# Patient Record
Sex: Male | Born: 2000 | Race: Black or African American | Hispanic: No | Marital: Single | Smoking: Never smoker
Health system: Southern US, Community
[De-identification: ages and names within clinical notes are randomized; demographics above are authoritative.]

## PROBLEM LIST (undated history)

## (undated) ENCOUNTER — Emergency Department (HOSPITAL_COMMUNITY): Admission: EM | Payer: No Typology Code available for payment source

---
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
## 2022-01-02 ENCOUNTER — Encounter (HOSPITAL_COMMUNITY): Payer: Self-pay

## 2022-01-02 ENCOUNTER — Emergency Department (HOSPITAL_COMMUNITY): Payer: Medicaid Other

## 2022-01-02 ENCOUNTER — Inpatient Hospital Stay (HOSPITAL_COMMUNITY)
Admission: EM | Admit: 2022-01-02 | Discharge: 2022-01-06 | DRG: 907 | Disposition: A | Discharge status: Home / Self Care | Payer: Medicaid Other | Attending: Surgery | Admitting: Surgery

## 2022-01-02 ENCOUNTER — Encounter (HOSPITAL_COMMUNITY): Admission: EM | Disposition: A | Discharge status: Home / Self Care | Payer: Self-pay | Source: Home / Self Care

## 2022-01-02 DIAGNOSIS — S82202B Unspecified fracture of shaft of left tibia, initial encounter for open fracture type I or II: Principal | ICD-10-CM

## 2022-01-02 DIAGNOSIS — S81832A Puncture wound without foreign body, left lower leg, initial encounter: Principal | ICD-10-CM

## 2022-01-02 DIAGNOSIS — S8412XA Injury of peroneal nerve at lower leg level, left leg, initial encounter: Secondary | ICD-10-CM | POA: Diagnosis present

## 2022-01-02 DIAGNOSIS — S85152A Other specified injury of anterior tibial artery, left leg, initial encounter: Principal | ICD-10-CM | POA: Diagnosis present

## 2022-01-02 DIAGNOSIS — D62 Acute posthemorrhagic anemia: Secondary | ICD-10-CM | POA: Diagnosis present

## 2022-01-02 DIAGNOSIS — E872 Acidosis, unspecified: Secondary | ICD-10-CM | POA: Diagnosis present

## 2022-01-02 DIAGNOSIS — S82252B Displaced comminuted fracture of shaft of left tibia, initial encounter for open fracture type I or II: Secondary | ICD-10-CM | POA: Diagnosis present

## 2022-01-02 DIAGNOSIS — W3400XA Accidental discharge from unspecified firearms or gun, initial encounter: Secondary | ICD-10-CM

## 2022-01-02 DIAGNOSIS — R578 Other shock: Secondary | ICD-10-CM | POA: Diagnosis present

## 2022-01-02 HISTORY — PX: WOUND EXPLORATION: SHX6188

## 2022-01-02 HISTORY — PX: VEIN HARVEST: SHX6363

## 2022-01-02 HISTORY — PX: TIBIA IM NAIL INSERTION: SHX2516

## 2022-01-02 HISTORY — PX: THROMBECTOMY FEMORAL ARTERY: SHX6406

## 2022-01-02 HISTORY — PX: FEMORAL ARTERY EXPLORATION: SHX5160

## 2022-01-02 LAB — I-STAT CHEM 8, ED
BUN: 14 mg/dL (ref 6–20)
Calcium, Ion: 0.93 mmol/L — ABNORMAL LOW (ref 1.15–1.40)
Chloride: 104 mmol/L (ref 98–111)
Creatinine, Ser: 1.2 mg/dL (ref 0.61–1.24)
Glucose, Bld: 131 mg/dL — ABNORMAL HIGH (ref 70–99)
HCT: 40 % (ref 39.0–52.0)
Hemoglobin: 13.6 g/dL (ref 13.0–17.0)
Potassium: 3 mmol/L — ABNORMAL LOW (ref 3.5–5.1)
Sodium: 139 mmol/L (ref 135–145)
TCO2: 20 mmol/L — ABNORMAL LOW (ref 22–32)

## 2022-01-02 LAB — CBC
HCT: 39.9 % (ref 39.0–52.0)
Hemoglobin: 13.1 g/dL (ref 13.0–17.0)
MCH: 32 pg (ref 26.0–34.0)
MCHC: 32.8 g/dL (ref 30.0–36.0)
MCV: 97.3 fL (ref 80.0–100.0)
Platelets: 211 10*3/uL (ref 150–400)
RBC: 4.1 MIL/uL — ABNORMAL LOW (ref 4.22–5.81)
RDW: 12.9 % (ref 11.5–15.5)
WBC: 6.7 10*3/uL (ref 4.0–10.5)
nRBC: 0 % (ref 0.0–0.2)

## 2022-01-02 LAB — PROTIME-INR
INR: 1.1 (ref 0.8–1.2)
Prothrombin Time: 13.6 seconds (ref 11.4–15.2)

## 2022-01-02 LAB — SAMPLE TO BLOOD BANK

## 2022-01-02 LAB — ABO/RH: ABO/RH(D): O POS

## 2022-01-02 LAB — PREPARE RBC (CROSSMATCH)

## 2022-01-02 SURGERY — INSERTION, INTRAMEDULLARY ROD, TIBIA
Anesthesia: General | Site: Leg Lower | Laterality: Right

## 2022-01-02 MED ORDER — IOHEXOL 350 MG/ML SOLN
100.0000 mL | Freq: Once | INTRAVENOUS | Status: AC | PRN
  Administered 2022-01-02: 100 mL via INTRAVENOUS

## 2022-01-02 MED ORDER — MIDAZOLAM HCL 2 MG/2ML IJ SOLN
INTRAMUSCULAR | Status: AC
  Filled 2022-01-02: qty 2

## 2022-01-02 MED ORDER — SODIUM CHLORIDE 0.9 % IV SOLN: 10.0000 mL/h | Freq: Once | INTRAVENOUS | Status: DC

## 2022-01-02 MED ORDER — FENTANYL CITRATE PF 50 MCG/ML IJ SOSY
50.0000 ug | PREFILLED_SYRINGE | Freq: Once | INTRAMUSCULAR | Status: AC
  Administered 2022-01-02: 50 ug via INTRAVENOUS

## 2022-01-02 MED ORDER — FENTANYL CITRATE (PF) 250 MCG/5ML IJ SOLN
INTRAMUSCULAR | Status: AC
  Filled 2022-01-02: qty 5

## 2022-01-02 MED ORDER — LACTATED RINGERS IV BOLUS: 1000.0000 mL | Freq: Once | INTRAVENOUS | Status: DC

## 2022-01-02 SURGICAL SUPPLY — 93 items
AGENT HMST KT MTR STRL THRMB (HEMOSTASIS) ×3
APL PRP STRL LF DISP 70% ISPRP (MISCELLANEOUS) ×3
BAG COUNTER SPONGE SURGICOUNT (BAG) ×5 IMPLANT
BAG SPNG CNTER NS LX DISP (BAG) ×3
BIT DRILL FREE HAND 4.2X130 (DRILL) IMPLANT
BIT DRILL LOCK 4.2X360 (DRILL) IMPLANT
BLADE SURG 10 STRL SS (BLADE) ×5 IMPLANT
BNDG COHESIVE 4X5 TAN STRL (GAUZE/BANDAGES/DRESSINGS) ×5 IMPLANT
BNDG ELASTIC 4X5.8 VLCR STR LF (GAUZE/BANDAGES/DRESSINGS) ×5 IMPLANT
BNDG ELASTIC 6X5.8 VLCR STR LF (GAUZE/BANDAGES/DRESSINGS) ×5 IMPLANT
BNDG GAUZE ELAST 4 BULKY (GAUZE/BANDAGES/DRESSINGS) ×5 IMPLANT
CANNULA VESSEL 3MM 2 BLNT TIP (CANNULA) ×1 IMPLANT
CATH EMB 2FR 60CM (CATHETERS) ×1 IMPLANT
CHLORAPREP W/TINT 26 (MISCELLANEOUS) ×5 IMPLANT
CLIP TI WIDE RED SMALL 24 (CLIP) ×1 IMPLANT
COVER MAYO STAND STRL (DRAPES) ×5 IMPLANT
COVER PROBE W GEL 5X96 (DRAPES) ×2 IMPLANT
COVER SURGICAL LIGHT HANDLE (MISCELLANEOUS) ×10 IMPLANT
CUFF TOURN SGL QUICK 34 (TOURNIQUET CUFF)
CUFF TOURN SGL QUICK 42 (TOURNIQUET CUFF) IMPLANT
CUFF TRNQT CYL 34X4.125X (TOURNIQUET CUFF) IMPLANT
DRAPE C-ARM 42X72 X-RAY (DRAPES) ×5 IMPLANT
DRAPE C-ARMOR (DRAPES) IMPLANT
DRAPE HALF SHEET 40X57 (DRAPES) ×5 IMPLANT
DRAPE IMP U-DRAPE 54X76 (DRAPES) ×5 IMPLANT
DRAPE ORTHO SPLIT 77X108 STRL (DRAPES) ×12
DRAPE SURG ORHT 6 SPLT 77X108 (DRAPES) IMPLANT
DRESSING OPSITE X SMALL 2X3 (GAUZE/BANDAGES/DRESSINGS) ×5 IMPLANT
DRILL FREE HAND 4.2X130 (DRILL) ×4
DRILL LOCK 4.2X360 (DRILL) ×4
DRSG ADAPTIC 3X8 NADH LF (GAUZE/BANDAGES/DRESSINGS) ×5 IMPLANT
DRSG COVADERM 4X6 (GAUZE/BANDAGES/DRESSINGS) ×3 IMPLANT
DRSG COVADERM 4X8 (GAUZE/BANDAGES/DRESSINGS) ×1 IMPLANT
DRSG TEGADERM 4X4.75 (GAUZE/BANDAGES/DRESSINGS) ×10 IMPLANT
ELECT CAUTERY BLADE 6.4 (BLADE) ×5 IMPLANT
ELECT REM PT RETURN 9FT ADLT (ELECTROSURGICAL) ×4
ELECTRODE REM PT RTRN 9FT ADLT (ELECTROSURGICAL) ×4 IMPLANT
GAUZE 4X4 16PLY ~~LOC~~+RFID DBL (SPONGE) ×1 IMPLANT
GAUZE SPONGE 4X4 12PLY STRL (GAUZE/BANDAGES/DRESSINGS) ×5 IMPLANT
GLOVE BIO SURGEON STRL SZ7.5 (GLOVE) ×5 IMPLANT
GLOVE BIOGEL PI IND STRL 7.5 (GLOVE) ×4 IMPLANT
GLOVE BIOGEL PI IND STRL 8 (GLOVE) ×4 IMPLANT
GLOVE BIOGEL PI INDICATOR 7.5 (GLOVE) ×1
GLOVE BIOGEL PI INDICATOR 8 (GLOVE) ×1
GLOVE SURG SYN 7.5  E (GLOVE) ×4
GLOVE SURG SYN 7.5 E (GLOVE) ×3 IMPLANT
GLOVE SURG SYN 7.5 PF PI (GLOVE) ×4 IMPLANT
GOWN STRL REUS W/ TWL LRG LVL3 (GOWN DISPOSABLE) ×12 IMPLANT
GOWN STRL REUS W/ TWL XL LVL3 (GOWN DISPOSABLE) ×8 IMPLANT
GOWN STRL REUS W/TWL LRG LVL3 (GOWN DISPOSABLE) ×12
GOWN STRL REUS W/TWL XL LVL3 (GOWN DISPOSABLE) ×8
GUIDEROD T2 3X1000 (ROD) ×1 IMPLANT
GUIDEWIRE GAMMA (WIRE) ×1 IMPLANT
K-WIRE FIXATION 3X285 COATED (WIRE) ×8
KIT BASIN OR (CUSTOM PROCEDURE TRAY) ×5 IMPLANT
KIT TURNOVER KIT B (KITS) ×5 IMPLANT
KWIRE FIXATION 3X285 COATED (WIRE) IMPLANT
MANIFOLD NEPTUNE II (INSTRUMENTS) ×5 IMPLANT
NAIL ELAS INSERT SLV SPI 8-11 (MISCELLANEOUS) ×1 IMPLANT
NAIL TIBIAL T2 9X360 (Nail) ×1 IMPLANT
NS IRRIG 1000ML POUR BTL (IV SOLUTION) ×5 IMPLANT
PACK ORTHO EXTREMITY (CUSTOM PROCEDURE TRAY) ×5 IMPLANT
PACK PERIPHERAL VASCULAR (CUSTOM PROCEDURE TRAY) ×1 IMPLANT
PACK UNIVERSAL I (CUSTOM PROCEDURE TRAY) ×5 IMPLANT
PAD ARMBOARD 7.5X6 YLW CONV (MISCELLANEOUS) ×10 IMPLANT
PAD CAST 4YDX4 CTTN HI CHSV (CAST SUPPLIES) ×4 IMPLANT
PADDING CAST COTTON 4X4 STRL (CAST SUPPLIES) ×4
REAMER INTRAMEDULLARY 8MM 510 (MISCELLANEOUS) ×1 IMPLANT
SCREW LOCK T2 5X35 (Screw) ×3 IMPLANT
SCREW LOCK T2 5X47.5 (Screw) ×1 IMPLANT
SPONGE T-LAP 18X18 ~~LOC~~+RFID (SPONGE) ×5 IMPLANT
STAPLER VISISTAT 35W (STAPLE) ×1 IMPLANT
SURGIFLO W/THROMBIN 8M KIT (HEMOSTASIS) ×1 IMPLANT
SUT ETHILON 3 0 PS 1 (SUTURE) ×3 IMPLANT
SUT MON AB 2-0 CT1 27 (SUTURE) ×5 IMPLANT
SUT PROLENE 6 0 BV (SUTURE) ×1 IMPLANT
SUT SILK 3 0 (SUTURE) ×4
SUT SILK 3-0 18XBRD TIE 12 (SUTURE) IMPLANT
SUT VIC AB 0 CT1 27 (SUTURE) ×4
SUT VIC AB 0 CT1 27XBRD ANBCTR (SUTURE) ×4 IMPLANT
SUT VIC AB 2-0 CT1 (SUTURE) ×1 IMPLANT
SUT VIC AB 3-0 SH 27 (SUTURE) ×12
SUT VIC AB 3-0 SH 27X BRD (SUTURE) IMPLANT
SUT VIC AB 3-0 SH 27XBRD (SUTURE) IMPLANT
SUT VIC AB 4-0 PS2 18 (SUTURE) ×1 IMPLANT
SYR BULB IRRIG 60ML STRL (SYRINGE) ×5 IMPLANT
SYR TB 1ML LUER SLIP (SYRINGE) ×1 IMPLANT
TOWEL GREEN STERILE (TOWEL DISPOSABLE) ×5 IMPLANT
TOWEL GREEN STERILE FF (TOWEL DISPOSABLE) ×5 IMPLANT
TOWEL OR NON WOVEN STRL DISP B (DISPOSABLE) ×5 IMPLANT
TUBE CONNECTING 12X1/4 (SUCTIONS) ×5 IMPLANT
WATER STERILE IRR 1000ML POUR (IV SOLUTION) ×5 IMPLANT
YANKAUER SUCT BULB TIP NO VENT (SUCTIONS) ×5 IMPLANT
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
## 2022-01-03 ENCOUNTER — Emergency Department (HOSPITAL_COMMUNITY): Payer: Medicaid Other | Admitting: Anesthesiology

## 2022-01-03 ENCOUNTER — Other Ambulatory Visit: Payer: Self-pay

## 2022-01-03 ENCOUNTER — Emergency Department (HOSPITAL_COMMUNITY): Payer: Medicaid Other

## 2022-01-03 DIAGNOSIS — S81832A Puncture wound without foreign body, left lower leg, initial encounter: Principal | ICD-10-CM

## 2022-01-03 DIAGNOSIS — S85152A Other specified injury of anterior tibial artery, left leg, initial encounter: Secondary | ICD-10-CM | POA: Diagnosis present

## 2022-01-03 DIAGNOSIS — E872 Acidosis, unspecified: Secondary | ICD-10-CM | POA: Diagnosis present

## 2022-01-03 DIAGNOSIS — S82252B Displaced comminuted fracture of shaft of left tibia, initial encounter for open fracture type I or II: Secondary | ICD-10-CM | POA: Diagnosis present

## 2022-01-03 DIAGNOSIS — W3400XA Accidental discharge from unspecified firearms or gun, initial encounter: Secondary | ICD-10-CM

## 2022-01-03 DIAGNOSIS — D62 Acute posthemorrhagic anemia: Secondary | ICD-10-CM | POA: Diagnosis present

## 2022-01-03 DIAGNOSIS — R578 Other shock: Secondary | ICD-10-CM | POA: Diagnosis present

## 2022-01-03 DIAGNOSIS — S8412XA Injury of peroneal nerve at lower leg level, left leg, initial encounter: Secondary | ICD-10-CM | POA: Diagnosis present

## 2022-01-03 LAB — COMPREHENSIVE METABOLIC PANEL
ALT: 17 U/L (ref 0–44)
AST: 22 U/L (ref 15–41)
Albumin: 3.9 g/dL (ref 3.5–5.0)
Alkaline Phosphatase: 47 U/L (ref 38–126)
Anion gap: 14 (ref 5–15)
BUN: 12 mg/dL (ref 6–20)
CO2: 21 mmol/L — ABNORMAL LOW (ref 22–32)
Calcium: 8.7 mg/dL — ABNORMAL LOW (ref 8.9–10.3)
Chloride: 104 mmol/L (ref 98–111)
Creatinine, Ser: 1.24 mg/dL (ref 0.61–1.24)
GFR, Estimated: >60 mL/min (ref >60)
Glucose, Bld: 131 mg/dL — ABNORMAL HIGH (ref 70–99)
Potassium: 3 mmol/L — ABNORMAL LOW (ref 3.5–5.1)
Sodium: 139 mmol/L (ref 135–145)
Total Bilirubin: 1.4 mg/dL — ABNORMAL HIGH (ref 0.3–1.2)
Total Protein: 6.9 g/dL (ref 6.5–8.1)

## 2022-01-03 LAB — CBC
HCT: 38.1 % — ABNORMAL LOW (ref 39.0–52.0)
Hemoglobin: 13 g/dL (ref 13.0–17.0)
MCH: 31.9 pg (ref 26.0–34.0)
MCHC: 34.1 g/dL (ref 30.0–36.0)
MCV: 93.6 fL (ref 80.0–100.0)
Platelets: 137 10*3/uL — ABNORMAL LOW (ref 150–400)
RBC: 4.07 MIL/uL — ABNORMAL LOW (ref 4.22–5.81)
RDW: 13.9 % (ref 11.5–15.5)
WBC: 10.4 10*3/uL (ref 4.0–10.5)
nRBC: 0 % (ref 0.0–0.2)

## 2022-01-03 LAB — POCT I-STAT, CHEM 8
BUN: 11 mg/dL (ref 6–20)
Calcium, Ion: 1.12 mmol/L — ABNORMAL LOW (ref 1.15–1.40)
Chloride: 103 mmol/L (ref 98–111)
Creatinine, Ser: 0.9 mg/dL (ref 0.61–1.24)
Glucose, Bld: 101 mg/dL — ABNORMAL HIGH (ref 70–99)
HCT: 35 % — ABNORMAL LOW (ref 39.0–52.0)
Hemoglobin: 11.9 g/dL — ABNORMAL LOW (ref 13.0–17.0)
Potassium: 4.3 mmol/L (ref 3.5–5.1)
Sodium: 139 mmol/L (ref 135–145)
TCO2: 23 mmol/L (ref 22–32)

## 2022-01-03 LAB — BASIC METABOLIC PANEL
Anion gap: 8 (ref 5–15)
BUN: 8 mg/dL (ref 6–20)
CO2: 23 mmol/L (ref 22–32)
Calcium: 8.2 mg/dL — ABNORMAL LOW (ref 8.9–10.3)
Chloride: 105 mmol/L (ref 98–111)
Creatinine, Ser: 1.15 mg/dL (ref 0.61–1.24)
GFR, Estimated: >60 mL/min (ref >60)
Glucose, Bld: 135 mg/dL — ABNORMAL HIGH (ref 70–99)
Potassium: 4.6 mmol/L (ref 3.5–5.1)
Sodium: 136 mmol/L (ref 135–145)

## 2022-01-03 LAB — HIV ANTIBODY (ROUTINE TESTING W REFLEX): HIV Screen 4th Generation wRfx: NONREACTIVE

## 2022-01-03 LAB — ETHANOL: Alcohol, Ethyl (B): 33 mg/dL — ABNORMAL HIGH (ref <10)

## 2022-01-03 LAB — LACTIC ACID, PLASMA: Lactic Acid, Venous: 6.3 mmol/L (ref 0.5–1.9)

## 2022-01-03 LAB — CREATININE, SERUM
Creatinine, Ser: 1.02 mg/dL (ref 0.61–1.24)
GFR, Estimated: >60 mL/min (ref >60)

## 2022-01-03 MED ORDER — METHOCARBAMOL 1000 MG/10ML IJ SOLN
500.0000 mg | Freq: Three times a day (TID) | INTRAVENOUS | Status: DC | PRN
  Filled 2022-01-03: qty 5

## 2022-01-03 MED ORDER — HEPARIN 6000 UNIT IRRIGATION SOLUTION
Status: DC | PRN
  Administered 2022-01-03: 1

## 2022-01-03 MED ORDER — SODIUM CHLORIDE 0.9 % IV SOLN
2.0000 g | INTRAVENOUS | Status: AC
  Administered 2022-01-03 – 2022-01-04 (×2): 2 g via INTRAVENOUS
  Filled 2022-01-03 (×2): qty 20

## 2022-01-03 MED ORDER — 0.9 % SODIUM CHLORIDE (POUR BTL) OPTIME
TOPICAL | Status: DC | PRN
  Administered 2022-01-03: 1000 mL

## 2022-01-03 MED ORDER — MIDAZOLAM HCL 5 MG/5ML IJ SOLN
INTRAMUSCULAR | Status: DC | PRN
  Administered 2022-01-03: 2 mg via INTRAVENOUS

## 2022-01-03 MED ORDER — ENOXAPARIN SODIUM 30 MG/0.3ML IJ SOSY
30.0000 mg | PREFILLED_SYRINGE | Freq: Two times a day (BID) | INTRAMUSCULAR | Status: DC
  Administered 2022-01-04 – 2022-01-06 (×5): 30 mg via SUBCUTANEOUS
  Filled 2022-01-03 (×5): qty 0.3

## 2022-01-03 MED ORDER — DEXAMETHASONE SODIUM PHOSPHATE 4 MG/ML IJ SOLN
INTRAMUSCULAR | Status: DC | PRN
  Administered 2022-01-03: 5 mg via INTRAVENOUS

## 2022-01-03 MED ORDER — MORPHINE SULFATE (PF) 2 MG/ML IV SOLN
1.0000 mg | INTRAVENOUS | Status: DC | PRN
  Administered 2022-01-04: 1 mg via INTRAVENOUS
  Filled 2022-01-03: qty 1

## 2022-01-03 MED ORDER — ONDANSETRON HCL 4 MG/2ML IJ SOLN: 4.0000 mg | Freq: Four times a day (QID) | INTRAMUSCULAR | Status: DC | PRN

## 2022-01-03 MED ORDER — ROCURONIUM BROMIDE 100 MG/10ML IV SOLN
INTRAVENOUS | Status: DC | PRN
  Administered 2022-01-03: 20 mg via INTRAVENOUS
  Administered 2022-01-03: 50 mg via INTRAVENOUS
  Administered 2022-01-03: 20 mg via INTRAVENOUS

## 2022-01-03 MED ORDER — KCL-LACTATED RINGERS-D5W 20 MEQ/L IV SOLN
INTRAVENOUS | Status: DC
  Filled 2022-01-03 (×4): qty 1000

## 2022-01-03 MED ORDER — ONDANSETRON HCL 4 MG/2ML IJ SOLN
INTRAMUSCULAR | Status: DC | PRN
Start: 2022-01-03 — End: 2022-01-03
  Administered 2022-01-03: 4 mg via INTRAVENOUS

## 2022-01-03 MED ORDER — PROTAMINE SULFATE 10 MG/ML IV SOLN
INTRAVENOUS | Status: DC | PRN
  Administered 2022-01-03: 40 mg via INTRAVENOUS
  Administered 2022-01-03: 10 mg via INTRAVENOUS

## 2022-01-03 MED ORDER — ONDANSETRON 4 MG PO TBDP
4.0000 mg | ORAL_TABLET | Freq: Four times a day (QID) | ORAL | Status: DC | PRN
Start: 2022-01-03 — End: 2022-01-06

## 2022-01-03 MED ORDER — FENTANYL CITRATE (PF) 100 MCG/2ML IJ SOLN
INTRAMUSCULAR | Status: DC | PRN
  Administered 2022-01-03 (×5): 50 ug via INTRAVENOUS

## 2022-01-03 MED ORDER — CEFAZOLIN SODIUM-DEXTROSE 2-3 GM-%(50ML) IV SOLR
INTRAVENOUS | Status: DC | PRN
  Administered 2022-01-03: 2 g via INTRAVENOUS

## 2022-01-03 MED ORDER — DEXMEDETOMIDINE (PRECEDEX) IN NS 20 MCG/5ML (4 MCG/ML) IV SYRINGE
PREFILLED_SYRINGE | INTRAVENOUS | Status: DC | PRN
  Administered 2022-01-03: 8 ug via INTRAVENOUS

## 2022-01-03 MED ORDER — PHENYLEPHRINE HCL-NACL 20-0.9 MG/250ML-% IV SOLN
INTRAVENOUS | Status: DC | PRN
  Administered 2022-01-03: 50 ug/min via INTRAVENOUS

## 2022-01-03 MED ORDER — HEPARIN 6000 UNIT IRRIGATION SOLUTION
Status: AC
  Filled 2022-01-03: qty 500

## 2022-01-03 MED ORDER — FENTANYL CITRATE (PF) 100 MCG/2ML IJ SOLN
25.0000 ug | INTRAMUSCULAR | Status: DC | PRN
  Administered 2022-01-03: 25 ug via INTRAVENOUS

## 2022-01-03 MED ORDER — FENTANYL CITRATE (PF) 100 MCG/2ML IJ SOLN
INTRAMUSCULAR | Status: AC
  Filled 2022-01-03: qty 2

## 2022-01-03 MED ORDER — OXYCODONE HCL 5 MG PO TABS
5.0000 mg | ORAL_TABLET | ORAL | Status: DC | PRN
  Administered 2022-01-03 (×3): 5 mg via ORAL
  Administered 2022-01-04 – 2022-01-06 (×8): 10 mg via ORAL
  Filled 2022-01-03 (×3): qty 2
  Filled 2022-01-03 (×2): qty 1
  Filled 2022-01-03: qty 2
  Filled 2022-01-03: qty 1
  Filled 2022-01-03 (×3): qty 2
  Filled 2022-01-03: qty 1
  Filled 2022-01-03: qty 2

## 2022-01-03 MED ORDER — SUCCINYLCHOLINE CHLORIDE 200 MG/10ML IV SOSY
PREFILLED_SYRINGE | INTRAVENOUS | Status: DC | PRN
  Administered 2022-01-03: 120 mg via INTRAVENOUS

## 2022-01-03 MED ORDER — ACETAMINOPHEN 325 MG PO TABS
650.0000 mg | ORAL_TABLET | ORAL | Status: DC | PRN
  Administered 2022-01-04: 650 mg via ORAL
  Filled 2022-01-03: qty 2

## 2022-01-03 MED ORDER — LIDOCAINE HCL (CARDIAC) PF 50 MG/5ML IV SOSY
PREFILLED_SYRINGE | INTRAVENOUS | Status: DC | PRN
  Administered 2022-01-03: 80 mg via INTRAVENOUS

## 2022-01-03 MED ORDER — PROTAMINE SULFATE 10 MG/ML IV SOLN
INTRAVENOUS | Status: AC
  Filled 2022-01-03: qty 5

## 2022-01-03 MED ORDER — ACETAMINOPHEN 10 MG/ML IV SOLN
1000.0000 mg | Freq: Once | INTRAVENOUS | Status: DC | PRN
  Administered 2022-01-03: 1000 mg via INTRAVENOUS

## 2022-01-03 MED ORDER — HEPARIN SODIUM (PORCINE) 1000 UNIT/ML IJ SOLN
INTRAMUSCULAR | Status: DC | PRN
Start: 2022-01-03 — End: 2022-01-03
  Administered 2022-01-03: 1000 [IU] via INTRAVENOUS
  Administered 2022-01-03: 6000 [IU] via INTRAVENOUS

## 2022-01-03 MED ORDER — DOCUSATE SODIUM 100 MG PO CAPS
100.0000 mg | ORAL_CAPSULE | Freq: Two times a day (BID) | ORAL | Status: DC
  Administered 2022-01-03 – 2022-01-06 (×7): 100 mg via ORAL
  Filled 2022-01-03 (×7): qty 1

## 2022-01-03 MED ORDER — LACTATED RINGERS IV SOLN
INTRAVENOUS | Status: DC | PRN
Start: 2022-01-03 — End: 2022-01-03

## 2022-01-03 MED ORDER — HEMOSTATIC AGENTS (NO CHARGE) OPTIME
TOPICAL | Status: DC | PRN
  Administered 2022-01-03: 1 via TOPICAL

## 2022-01-03 MED ORDER — PROCHLORPERAZINE MALEATE 10 MG PO TABS: 10.0000 mg | ORAL_TABLET | Freq: Four times a day (QID) | ORAL | Status: DC | PRN

## 2022-01-03 MED ORDER — SUGAMMADEX SODIUM 200 MG/2ML IV SOLN
INTRAVENOUS | Status: DC | PRN
  Administered 2022-01-03: 200 mg via INTRAVENOUS

## 2022-01-03 MED ORDER — METHOCARBAMOL 500 MG PO TABS
500.0000 mg | ORAL_TABLET | Freq: Three times a day (TID) | ORAL | Status: DC | PRN
  Administered 2022-01-03 – 2022-01-04 (×2): 500 mg via ORAL
  Filled 2022-01-03 (×3): qty 1

## 2022-01-03 MED ORDER — PROCHLORPERAZINE EDISYLATE 10 MG/2ML IJ SOLN: 5.0000 mg | Freq: Four times a day (QID) | INTRAMUSCULAR | Status: DC | PRN

## 2022-01-03 MED ORDER — HEPARIN SODIUM (PORCINE) 1000 UNIT/ML IJ SOLN
INTRAMUSCULAR | Status: AC
  Filled 2022-01-03: qty 10

## 2022-01-03 MED ORDER — PROPOFOL 10 MG/ML IV BOLUS
INTRAVENOUS | Status: AC
  Filled 2022-01-03: qty 20

## 2022-01-03 MED ORDER — PROPOFOL 10 MG/ML IV BOLUS
INTRAVENOUS | Status: DC | PRN
  Administered 2022-01-03: 160 mg via INTRAVENOUS

## 2022-01-03 MED ORDER — ACETAMINOPHEN 10 MG/ML IV SOLN
INTRAVENOUS | Status: AC
  Filled 2022-01-03: qty 100

## 2022-01-03 NOTE — Anesthesia Postprocedure Evaluation (Signed)
Anesthesia Post Note ? ?Patient: Kevin Bridges ? ?Procedure(s) Performed: INTRAMEDULLARY (IM) NAIL TIBIAL (Left) ?WOUND EXPLORATION (Left) ?LEFT ANTERIOR TIBIAL ARTERY BYPASS (Left: Leg Lower) ?RIGHT LOWER SAPHNEOUS VEIN HARVEST (Right: Leg Lower) ?LEFT THROMBECTOMY ANTERIOR TIBIAL ARTERY (Left: Leg Lower) ? ?  ? ?Patient location during evaluation: PACU ?Anesthesia Type: General ?Level of consciousness: awake and alert ?Pain management: pain level controlled ?Vital Signs Assessment: post-procedure vital signs reviewed and stable ?Respiratory status: spontaneous breathing, nonlabored ventilation, respiratory function stable and patient connected to nasal cannula oxygen ?Cardiovascular status: blood pressure returned to baseline and stable ?Postop Assessment: no apparent nausea or vomiting ?Anesthetic complications: no ? ? ?No notable events documented. ? ?Last Vitals:  ?Vitals:  ? 01/03/22 0545 01/03/22 0618  ?BP: 111/68 136/74  ?Pulse: 74 88  ?Resp: 16 15  ?Temp:  37.3 ?C  ?SpO2: 99% 99%  ?  ?Last Pain:  ?Vitals:  ? 01/03/22 0618  ?TempSrc: Oral  ?PainSc:   ? ? ?  ?  ?  ?  ?  ?  ? ?[name] ? ? ? ? ?
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
## 2022-01-03 NOTE — Anesthesia Procedure Notes (Addendum)
Procedure Name: Intubation ?Date/Time: 01/03/2022 1:00 AM ?Performed by: Suzy Bouchard, CRNA ?Pre-anesthesia Checklist: Patient identified, Emergency Drugs available, Suction available, Patient being monitored and Timeout performed ?Patient Re-evaluated:Patient Re-evaluated prior to induction ?Oxygen Delivery Method: Circle system utilized ?Preoxygenation: Pre-oxygenation with 100% oxygen ?Induction Type: Rapid sequence and IV induction ?Laryngoscope Size: Sabra Heck and 2 ?Grade View: Grade I ?Tube type: Oral ?Tube size: 7.5 mm ?Number of attempts: 2 ?Airway Equipment and Method: Stylet ?Placement Confirmation: ETT inserted through vocal cords under direct vision, positive ETCO2 and breath sounds checked- equal and bilateral ?Secured at: 23 cm ?Tube secured with: Tape ?Dental Injury: Teeth and Oropharynx as per pre-operative assessment  ?Comments: Propofol to loss of lid reflex; Sux given.  DL x1, cords closed; waited 15 second; DL x2 cords open and clear, Gr 1 view.  ? ? ? ? ?
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
## 2022-01-03 NOTE — Transfer of Care (Signed)
Immediate Anesthesia Transfer of Care Note ? ?Patient: Kevin Bridges ? ?Procedure(s) Performed: INTRAMEDULLARY (IM) NAIL TIBIAL (Left) ?WOUND EXPLORATION (Left) ?LEFT ANTERIOR TIBIAL ARTERY BYPASS (Left: Leg Lower) ?RIGHT LOWER SAPHNEOUS VEIN HARVEST (Right: Leg Lower) ?LEFT THROMBECTOMY ANTERIOR TIBIAL ARTERY (Left: Leg Lower) ? ?Patient Location: PACU ? ?Anesthesia Type:General ? ?Level of Consciousness: drowsy and responds to stimulation ? ?Airway & Oxygen Therapy: Patient Spontanous Breathing and Patient connected to nasal cannula oxygen ? ?Post-op Assessment: Report given to RN and Post -op Vital signs reviewed and stable ? ?Post vital signs: Reviewed and stable ? ?Last Vitals:  ?Vitals Value Taken Time  ?BP 131/69 01/03/22 0458  ?Temp    ?Pulse 96 01/03/22 0502  ?Resp 16 01/03/22 0502  ?SpO2 100 % 01/03/22 0502  ?Vitals shown include unvalidated device data. ? ?Last Pain:  ?Vitals:  ? 01/02/22 2312  ?TempSrc: Oral  ?   ? ?  ? ?Complications: No notable events documented. ?
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
## 2022-01-04 LAB — BASIC METABOLIC PANEL
Anion gap: 5 (ref 5–15)
BUN: 7 mg/dL (ref 6–20)
CO2: 29 mmol/L (ref 22–32)
Calcium: 8.3 mg/dL — ABNORMAL LOW (ref 8.9–10.3)
Chloride: 106 mmol/L (ref 98–111)
Creatinine, Ser: 1.1 mg/dL (ref 0.61–1.24)
GFR, Estimated: >60 mL/min (ref >60)
Glucose, Bld: 115 mg/dL — ABNORMAL HIGH (ref 70–99)
Potassium: 4 mmol/L (ref 3.5–5.1)
Sodium: 140 mmol/L (ref 135–145)

## 2022-01-04 LAB — CBC
HCT: 32.7 % — ABNORMAL LOW (ref 39.0–52.0)
Hemoglobin: 10.9 g/dL — ABNORMAL LOW (ref 13.0–17.0)
MCH: 31.7 pg (ref 26.0–34.0)
MCHC: 33.3 g/dL (ref 30.0–36.0)
MCV: 95.1 fL (ref 80.0–100.0)
Platelets: 145 10*3/uL — ABNORMAL LOW (ref 150–400)
RBC: 3.44 MIL/uL — ABNORMAL LOW (ref 4.22–5.81)
RDW: 14 % (ref 11.5–15.5)
WBC: 8.2 10*3/uL (ref 4.0–10.5)
nRBC: 0 % (ref 0.0–0.2)

## 2022-01-04 NOTE — Progress Notes (Signed)
Subjective: ?2 Days Post-Op Procedure(s) (LRB): ?INTRAMEDULLARY (IM) NAIL TIBIAL (Left) ?WOUND EXPLORATION (Left) ?LEFT ANTERIOR TIBIAL ARTERY BYPASS (Left) ?RIGHT LOWER SAPHNEOUS VEIN HARVEST (Right) ?LEFT THROMBECTOMY ANTERIOR TIBIAL ARTERY (Left) ?Patient reports pain as moderate.   ? ?Objective: ?Vital signs in last 24 hours: ?Temp:  [98.4 ?F (36.9 ?C)-98.7 ?F (37.1 ?C)] 98.6 ?F (37 ?C) (05/07 0758) ?Pulse Rate:  [67-96] 67 (05/07 0758) ?Resp:  [13-20] 13 (05/07 0758) ?BP: (102-126)/(64-73) 121/73 (05/07 0758) ?SpO2:  [96 %-98 %] 96 % (05/06 1458) ? ?Intake/Output from previous day: ?05/06 0701 - 05/07 0700 ?In: 2224.6 [P.O.:480; I.V.:1744.6] ?Out: 2200 [Urine:2200] ?Intake/Output this shift: ?No intake/output data recorded. ? ?Recent Labs  ?  01/02/22 ?2305 01/02/22 ?2313 01/03/22 ?0329 01/03/22 ?0715 01/04/22 ?0230  ?HGB 13.1 13.6 11.9* 13.0 10.9*  ? ?Recent Labs  ?  01/03/22 ?0715 01/04/22 ?0230  ?WBC 10.4 8.2  ?RBC 4.07* 3.44*  ?HCT 38.1* 32.7*  ?PLT 137* 145*  ? ?Recent Labs  ?  01/03/22 ?1206 01/04/22 ?0230  ?NA 136 140  ?K 4.6 4.0  ?CL 105 106  ?CO2 23 29  ?BUN 8 7  ?CREATININE 1.15 1.10  ?GLUCOSE 135* 115*  ?CALCIUM 8.2* 8.3*  ? ?Recent Labs  ?  01/02/22 ?2305  ?INR 1.1  ? ? ?Neurovascular intact ?Sensation intact distally ?Intact pulses distally ?Dorsiflexion/Plantar flexion intact ?Incision: dressing C/D/I ?Compartment soft ? ? ?Assessment/Plan: ?2 Days Post-Op Procedure(s) (LRB): ?INTRAMEDULLARY (IM) NAIL TIBIAL (Left) ?WOUND EXPLORATION (Left) ?LEFT ANTERIOR TIBIAL ARTERY BYPASS (Left) ?RIGHT LOWER SAPHNEOUS VEIN HARVEST (Right) ?LEFT THROMBECTOMY ANTERIOR TIBIAL ARTERY (Left) ? ?Partial WB LLE 25% ?Up with PT if cleared by vascular ?Lovenox current dvt proph ?Pain control as ordered ?Will cont to follow in house plan on outpt f/u Dr. Renaye Rakers in 2 weeks. ? ? ?Margart Sickles ?01/04/2022, 9:15 AM ? ?
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
## 2022-01-05 ENCOUNTER — Inpatient Hospital Stay (HOSPITAL_COMMUNITY): Payer: Medicaid Other

## 2022-01-05 ENCOUNTER — Encounter (HOSPITAL_COMMUNITY): Payer: Self-pay | Admitting: Orthopedic Surgery

## 2022-01-05 LAB — BASIC METABOLIC PANEL
Anion gap: 7 (ref 5–15)
BUN: <5 mg/dL — ABNORMAL LOW (ref 6–20)
CO2: 31 mmol/L (ref 22–32)
Calcium: 9.2 mg/dL (ref 8.9–10.3)
Chloride: 99 mmol/L (ref 98–111)
Creatinine, Ser: 0.99 mg/dL (ref 0.61–1.24)
GFR, Estimated: >60 mL/min (ref >60)
Glucose, Bld: 103 mg/dL — ABNORMAL HIGH (ref 70–99)
Potassium: 3.8 mmol/L (ref 3.5–5.1)
Sodium: 137 mmol/L (ref 135–145)

## 2022-01-05 LAB — CBC
HCT: 34.6 % — ABNORMAL LOW (ref 39.0–52.0)
Hemoglobin: 11.4 g/dL — ABNORMAL LOW (ref 13.0–17.0)
MCH: 31.4 pg (ref 26.0–34.0)
MCHC: 32.9 g/dL (ref 30.0–36.0)
MCV: 95.3 fL (ref 80.0–100.0)
Platelets: 162 10*3/uL (ref 150–400)
RBC: 3.63 MIL/uL — ABNORMAL LOW (ref 4.22–5.81)
RDW: 13.3 % (ref 11.5–15.5)
WBC: 6.5 10*3/uL (ref 4.0–10.5)
nRBC: 0 % (ref 0.0–0.2)

## 2022-01-05 MED ORDER — METHOCARBAMOL 500 MG PO TABS
1000.0000 mg | ORAL_TABLET | Freq: Three times a day (TID) | ORAL | Status: DC
  Administered 2022-01-05 – 2022-01-06 (×4): 1000 mg via ORAL
  Filled 2022-01-05 (×5): qty 2

## 2022-01-05 MED ORDER — POLYETHYLENE GLYCOL 3350 17 G PO PACK
17.0000 g | PACK | Freq: Every day | ORAL | Status: DC
  Administered 2022-01-05 – 2022-01-06 (×2): 17 g via ORAL
  Filled 2022-01-05 (×2): qty 1

## 2022-01-05 MED ORDER — ACETAMINOPHEN 325 MG PO TABS
650.0000 mg | ORAL_TABLET | Freq: Four times a day (QID) | ORAL | Status: DC
  Administered 2022-01-05 – 2022-01-06 (×5): 650 mg via ORAL
  Filled 2022-01-05 (×6): qty 2

## 2022-01-05 NOTE — Progress Notes (Signed)
Pt handed the note he requested for his court appointment tomorrow. Pt stated he will give it to his mother when she comes up. Pt's mother on the phone aware. Dionne Bucy RN ?
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
## 2022-01-05 NOTE — Progress Notes (Signed)
Orthopedic Tech Progress Note ?Patient Details:  ?Aleix Karmel ?09-Apr-2001 ?PX:[id number] ? ?Ortho Devices ?Type of Ortho Device: CAM walker, Prafo boot/shoe ?Ortho Device/Splint Location: LLE ?Ortho Device/Splint Interventions: Ordered, Application, Adjustment ?  ?Post Interventions ?Patient Tolerated: Well, Fair ?Instructions Provided: Care of device ? ?Janit Pagan ?01/05/2022, 10:45 AM ? ?

## 2022-01-05 NOTE — Discharge Instructions (Signed)
Diet: As you were doing prior to hospitalization  ? ?Shower:  May shower but keep the wounds dry, use an occlusive plastic wrap, NO SOAKING IN TUB.  If the bandage gets wet, change with a clean dry gauze.  ? ?Dressing:  You may change your dressing 3-5 days after surgery, unless you have a splint.  We will plan to remove your stitches in about 2 weeks in the office.   ? ?Activity:  Increase activity slowly as tolerated, but follow the weight bearing instructions below.  The rules on driving is that you can not be taking narcotics while you drive, and you must feel in control of the vehicle.   ? ?Weight Bearing:  weight bearing as tolerated on left leg ? ?To prevent constipation: you may use a stool softener such as - ? ?Colace (over the counter) 100 mg by mouth twice a day  ?Drink plenty of fluids (prune juice may be helpful) and high fiber foods ?Miralax (over the counter) for constipation as needed.   ? ?Itching:  If you experience itching with your medications, try taking only a single pain pill, or even half a pain pill at a time.  You can also use benadryl over the counter for itching or also to help with sleep.  ? ?Precautions:  If you experience chest pain or shortness of breath - call 911 immediately for transfer to the hospital emergency department!! ? ?If you develop a fever greater that 101 F, purulent drainage from wound, increased redness or drainage from wound, or calf pain -- Call the office at [phone]               ?                                  ?Follow- Up Appointment:  Please call for an appointment to be seen in 2 weeks [place] - [phone] ? ? ?  ?
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
## 2022-01-05 NOTE — TOC CAGE-AID Note (Signed)
Transition of Care (TOC) - CAGE-AID Screening ? ? ?Patient Details  ?Name: Kevin Bridges ?MRN: [id number] ?Date of Birth: 09/30/2000 ? ?Transition of Care (TOC) CM/SW Contact:    ?Horice Carrero C Tarpley-Carter, LCSWA ?Phone Number: ?01/05/2022, 12:57 PM ? ? ?Clinical Narrative: ?Pt participated in Cage-Aid.  Pt stated he does not use substance or ETOH.  Pt was not offered resources, due to no usage of substance or ETOH.    ? ?Insurance underwriter, MSW, LCSW-A ?Pronouns:  She/Her/Hers ?[facility] ?Clinical Social Worker ?Direct Number:  [phone] ?[email or web address] ? ?CAGE-AID Screening: ?  ? ?Have You Ever Felt You Ought to Cut Down on Your Drinking or Drug Use?: No ?Have People Annoyed You By Critizing Your Drinking Or Drug Use?: No ?Have You Felt Bad Or Guilty About Your Drinking Or Drug Use?: No ?Have You Ever Had a Drink or Used Drugs First Thing In The Morning to Steady Your Nerves or to Get Rid of a Hangover?: No ?CAGE-AID Score: 0 ? ?Substance Abuse Education Offered: No ? ?  ? ? ? ? ? ? ?
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
## 2022-01-05 NOTE — Progress Notes (Addendum)
?  Progress Note ? ? ? ?01/05/2022 ?8:18 AM ?3 Days Post-Op ? ?Subjective:  no major complaints. Pain in left leg overall manageable. Pain increased with weight bearing ? ? ?Vitals:  ? 01/05/22 0320 01/05/22 0744  ?BP: 136/72 136/70  ?Pulse: 92 83  ?Resp: 18 15  ?Temp: 99.3 ?F (37.4 ?C) 97.8 ?F (36.6 ?C)  ?SpO2: 99% 99%  ? ?Physical Exam: ?Cardiac:  regular ?Lungs:  non labored ?Extremities:  well perfused and warm. Left ankle dressings clean, dry and intact. Were just changed. Left DP pulse palpable. Left foot warm. Diminished motor. Left calf soft. Right leg saphenectomy sites c/d/i ?Neurologic: alert and oriented ? ?CBC ?   ?Component Value Date/Time  ? WBC 8.2 01/04/2022 0230  ? RBC 3.44 (L) 01/04/2022 0230  ? HGB 10.9 (L) 01/04/2022 0230  ? HCT 32.7 (L) 01/04/2022 0230  ? PLT 145 (L) 01/04/2022 0230  ? MCV 95.1 01/04/2022 0230  ? MCH 31.7 01/04/2022 0230  ? MCHC 33.3 01/04/2022 0230  ? RDW 14.0 01/04/2022 0230  ? ? ?BMET ?   ?Component Value Date/Time  ? NA 140 01/04/2022 0230  ? K 4.0 01/04/2022 0230  ? CL 106 01/04/2022 0230  ? CO2 29 01/04/2022 0230  ? GLUCOSE 115 (H) 01/04/2022 0230  ? BUN 7 01/04/2022 0230  ? CREATININE 1.10 01/04/2022 0230  ? CALCIUM 8.3 (L) 01/04/2022 0230  ? GFRNONAA >60 01/04/2022 0230  ? ? ?INR ?   ?Component Value Date/Time  ? INR 1.1 01/02/2022 2305  ? ? ? ?Intake/Output Summary (Last 24 hours) at 01/05/2022 0818 ?Last data filed at 01/04/2022 1200 ?Gross per 24 hour  ?Intake 700 ml  ?Output 1000 ml  ?Net -300 ml  ? ? ? ?Assessment/Plan:  21 y.o. male is s/p   #1: Left anterior tibial artery bypass graft #2: Harvest of right great saphenous vein #3: Thrombectomy of left anterior tibial artery Post op Day 2 ? ?Left lower extremity well perfused and warm. Palpable DP pulse ?Right leg saphenectomy sites well appearing ?Left calf remains soft. No concern for compartment syndrome ?Continue daily dressing changes ?From vascular surgery perspective okay to bear weight as tolerated- 25% per  ortho ?Will continue to follow while inpatient. Will arrange outpatient follow up in 2 weeks  ? ?DVT prophylaxis: lovenox ? ? ?Graceann Congress, PA-C ?[facility] ?[phone] ?01/05/2022 ?8:18 AM ? ?I agree with the above.  The patient can be started on 81 mg aspirin.  Can be discharged when other issues are resolved from my perspective ? ?Kevin Bridges ?

## 2022-01-06 ENCOUNTER — Other Ambulatory Visit (HOSPITAL_COMMUNITY): Payer: Self-pay

## 2022-01-06 LAB — TYPE AND SCREEN
ABO/RH(D): O POS
Antibody Screen: NEGATIVE
Unit division: 0
Unit division: 0
Unit division: 0

## 2022-01-06 LAB — BPAM RBC
Blood Product Expiration Date: 202305222359
Blood Product Expiration Date: 202305282359
Blood Product Expiration Date: 202306042359
ISSUE DATE / TIME: 202305052340
ISSUE DATE / TIME: 202305052340
Unit Type and Rh: 5100
Unit Type and Rh: 5100
Unit Type and Rh: 5100

## 2022-01-06 MED ORDER — DOCUSATE SODIUM 100 MG PO CAPS: 100.0000 mg | ORAL_CAPSULE | Freq: Every day | ORAL | Status: AC | PRN

## 2022-01-06 MED ORDER — IBUPROFEN 200 MG PO TABS: 600.0000 mg | ORAL_TABLET | Freq: Four times a day (QID) | ORAL | Status: DC | PRN

## 2022-01-06 MED ORDER — ASPIRIN 81 MG PO TBEC
81.0000 mg | DELAYED_RELEASE_TABLET | Freq: Every day | ORAL | 0 refills | Status: AC
  Filled 2022-01-06: qty 30, 30d supply, fill #0

## 2022-01-06 MED ORDER — METHOCARBAMOL 500 MG PO TABS
1000.0000 mg | ORAL_TABLET | Freq: Three times a day (TID) | ORAL | 0 refills | Status: AC | PRN
  Filled 2022-01-06: qty 60, 10d supply, fill #0

## 2022-01-06 MED ORDER — POLYETHYLENE GLYCOL 3350 17 G PO PACK
17.0000 g | PACK | Freq: Every day | ORAL | Status: AC | PRN
Start: 2022-01-06 — End: ?

## 2022-01-06 MED ORDER — OXYCODONE HCL 5 MG PO TABS
5.0000 mg | ORAL_TABLET | Freq: Four times a day (QID) | ORAL | 0 refills | Status: DC | PRN
  Filled 2022-01-06: qty 30, 4d supply, fill #0
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
## 2022-01-06 NOTE — TOC Transition Note (Signed)
Transition of Care (TOC) - CM/SW Discharge Note ? ? ?Patient Details  ?Name: Kevin Bridges ?MRN: [id number] ?Date of Birth: April 15, 2001 ? ?Transition of Care [facility]) CM/SW Contact:  ?Glennon Mac, RN ?Phone Number: ?01/06/2022, 1:38 PM ? ? ?Clinical Narrative:    ?Pt medically stable for discharge home today with mother to assist with care.  PT/OT recommending OP therapies, and pt/mom agreeable to referral to [facility]. Location. Referral to [facility] for recommended DME; 3 in1 and RW to be delivered to bedside prior to dc.   ?Reviewed all information with patient's mother, [name]:  she states she will be able to transport patient to OP appts.  She denies any other needs for home.   ? ? ?Final next level of care: OP Rehab ?Barriers to Discharge: Barriers Resolved ? ? ?Patient Goals and CMS Choice ?Patient states their goals for this hospitalization and ongoing recovery are:: to go home ?  ?  ? ?           ?  ?  ?  ?  ? ?Discharge Plan and Services ?  ?Discharge Planning Services: CM Consult ?           ?DME Arranged: 3-N-1, Walker rolling ?  ?Date DME Agency Contacted: 01/06/22 ?Time DME Agency Contacted: [phone] ?Representative spoke with at DME Agency: Leavy Cella ?  ?  ?  ?  ?  ? ?Social Determinants of Health (SDOH) Interventions ?  ? ? ?Readmission Risk Interventions ?   ? View : No data to display.  ?  ?  ?  ? ?Quintella Baton, RN, BSN  ?Trauma/Neuro ICU Case Manager ?[phone] ? ? ? ? ?

## 2022-01-07 ENCOUNTER — Encounter (HOSPITAL_COMMUNITY): Payer: Self-pay
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
## 2022-01-20 ENCOUNTER — Encounter: Payer: Self-pay | Admitting: Physician Assistant

## 2022-01-20 ENCOUNTER — Ambulatory Visit (INDEPENDENT_AMBULATORY_CARE_PROVIDER_SITE_OTHER): Payer: Medicaid Other | Admitting: Physician Assistant

## 2022-01-20 ENCOUNTER — Ambulatory Visit: Payer: Medicaid Other | Attending: Physician Assistant

## 2022-01-20 VITALS — BP 109/58 | HR 99 | Temp 98.6°F | Resp 20 | Ht 68.0 in | Wt 137.0 lb

## 2022-01-20 DIAGNOSIS — S85132D Unspecified injury of anterior tibial artery, left leg, subsequent encounter: Secondary | ICD-10-CM

## 2022-01-20 DIAGNOSIS — M6281 Muscle weakness (generalized): Secondary | ICD-10-CM | POA: Diagnosis present

## 2022-01-20 DIAGNOSIS — R2689 Other abnormalities of gait and mobility: Secondary | ICD-10-CM | POA: Insufficient documentation

## 2022-01-20 DIAGNOSIS — S82202B Unspecified fracture of shaft of left tibia, initial encounter for open fracture type I or II: Secondary | ICD-10-CM | POA: Diagnosis not present

## 2022-01-20 DIAGNOSIS — R262 Difficulty in walking, not elsewhere classified: Secondary | ICD-10-CM | POA: Insufficient documentation

## 2022-01-20 DIAGNOSIS — R2681 Unsteadiness on feet: Secondary | ICD-10-CM | POA: Diagnosis present

## 2022-01-20 DIAGNOSIS — M79662 Pain in left lower leg: Secondary | ICD-10-CM | POA: Insufficient documentation

## 2022-01-20 DIAGNOSIS — S81832D Puncture wound without foreign body, left lower leg, subsequent encounter: Secondary | ICD-10-CM

## 2022-01-20 MED ORDER — OXYCODONE HCL 5 MG PO TABS: 5.0000 mg | ORAL_TABLET | Freq: Four times a day (QID) | ORAL | 0 refills | Status: AC | PRN
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
## 2022-02-04 ENCOUNTER — Ambulatory Visit: Payer: Medicaid Other | Attending: Physician Assistant

## 2022-02-04 DIAGNOSIS — M79662 Pain in left lower leg: Secondary | ICD-10-CM | POA: Insufficient documentation

## 2022-02-04 DIAGNOSIS — R262 Difficulty in walking, not elsewhere classified: Secondary | ICD-10-CM | POA: Insufficient documentation

## 2022-02-04 DIAGNOSIS — M6281 Muscle weakness (generalized): Secondary | ICD-10-CM | POA: Insufficient documentation
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
## 2022-02-05 ENCOUNTER — Telehealth: Payer: Self-pay

## 2022-02-05 NOTE — Telephone Encounter (Signed)
PT called and left message for patient's mother regarding missed visit on 02/04/2022.  Left reminder of next appointment and attendance policy.

## 2022-02-07 ENCOUNTER — Ambulatory Visit: Payer: Medicaid Other

## 2022-02-07 ENCOUNTER — Telehealth: Payer: Self-pay
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
## 2022-02-07 NOTE — Telephone Encounter (Signed)
LVM for patient's mother regarding missed appointment this morning.  2nd no-show Due to attendance policy, further appointments beyond next will be cancelled and patient will only be allowed to schedule one at a time going forward.  Evelene Croon, PTA 02/07/22 10:20 AM

## 2022-02-09 ENCOUNTER — Ambulatory Visit: Payer: Medicaid Other

## 2022-02-09 DIAGNOSIS — M79662 Pain in left lower leg: Secondary | ICD-10-CM | POA: Diagnosis present

## 2022-02-09 DIAGNOSIS — M6281 Muscle weakness (generalized): Secondary | ICD-10-CM

## 2022-02-09 DIAGNOSIS — R262 Difficulty in walking, not elsewhere classified: Secondary | ICD-10-CM
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
## 2022-02-11 ENCOUNTER — Ambulatory Visit: Payer: Medicaid Other
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
## 2022-02-14 ENCOUNTER — Ambulatory Visit: Payer: Medicaid Other

## 2022-02-18 ENCOUNTER — Ambulatory Visit: Payer: Medicaid Other
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
## 2023-10-29 ENCOUNTER — Emergency Department (HOSPITAL_BASED_OUTPATIENT_CLINIC_OR_DEPARTMENT_OTHER)
Admission: EM | Admit: 2023-10-29 | Discharge: 2023-10-30 | Discharge status: Against Medical Advice | Payer: Medicaid Other | Attending: Emergency Medicine | Admitting: Emergency Medicine

## 2023-10-29 ENCOUNTER — Other Ambulatory Visit: Payer: Self-pay

## 2023-10-29 ENCOUNTER — Encounter (HOSPITAL_BASED_OUTPATIENT_CLINIC_OR_DEPARTMENT_OTHER): Payer: Self-pay | Admitting: Emergency Medicine

## 2023-10-29 DIAGNOSIS — Z5321 Procedure and treatment not carried out due to patient leaving prior to being seen by health care provider: Secondary | ICD-10-CM | POA: Insufficient documentation

## 2023-10-29 DIAGNOSIS — J101 Influenza due to other identified influenza virus with other respiratory manifestations: Secondary | ICD-10-CM | POA: Diagnosis not present

## 2023-10-29 DIAGNOSIS — R0602 Shortness of breath: Secondary | ICD-10-CM | POA: Diagnosis present

## 2023-10-29 LAB — RESP PANEL BY RT-PCR (RSV, FLU A&B, COVID)  RVPGX2
Influenza A by PCR: POSITIVE — AB
Influenza B by PCR: NEGATIVE
Resp Syncytial Virus by PCR: NEGATIVE
SARS Coronavirus 2 by RT PCR: NEGATIVE

## 2023-10-29 LAB — BASIC METABOLIC PANEL
Anion gap: 11 (ref 5–15)
BUN: 16 mg/dL (ref 6–20)
CO2: 25 mmol/L (ref 22–32)
Calcium: 9.5 mg/dL (ref 8.9–10.3)
Chloride: 98 mmol/L (ref 98–111)
Creatinine, Ser: 1.02 mg/dL (ref 0.61–1.24)
GFR, Estimated: >60 mL/min (ref >60)
Glucose, Bld: 109 mg/dL — ABNORMAL HIGH (ref 70–99)
Potassium: 3.7 mmol/L (ref 3.5–5.1)
Sodium: 134 mmol/L — ABNORMAL LOW (ref 135–145)

## 2023-10-29 LAB — TROPONIN I (HIGH SENSITIVITY): Troponin I (High Sensitivity): 3 ng/L (ref <18)

## 2023-10-29 LAB — CBC
HCT: 43.6 % (ref 39.0–52.0)
Hemoglobin: 14.9 g/dL (ref 13.0–17.0)
MCH: 31.8 pg (ref 26.0–34.0)
MCHC: 34.2 g/dL (ref 30.0–36.0)
MCV: 93 fL (ref 80.0–100.0)
Platelets: 137 10*3/uL — ABNORMAL LOW (ref 150–400)
RBC: 4.69 MIL/uL (ref 4.22–5.81)
RDW: 13.4 % (ref 11.5–15.5)
WBC: 7.6 10*3/uL (ref 4.0–10.5)
nRBC: 0 % (ref 0.0–0.2)

## 2023-10-29 NOTE — ED Notes (Signed)
 No answer when called to room
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
